# Patient Record
Sex: Male | Born: 1995 | Race: White | Hispanic: No | Marital: Single | Smoking: Never smoker
Health system: Southern US, Community
[De-identification: ages and names within clinical notes are randomized; demographics above are authoritative.]

---
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
## 2015-01-04 ENCOUNTER — Ambulatory Visit: Payer: 59 | Admitting: Family Medicine

## 2015-01-19 ENCOUNTER — Ambulatory Visit (INDEPENDENT_AMBULATORY_CARE_PROVIDER_SITE_OTHER): Payer: 59 | Admitting: Family Medicine

## 2015-01-19 ENCOUNTER — Ambulatory Visit: Payer: 59 | Admitting: Family Medicine

## 2015-01-19 ENCOUNTER — Encounter: Payer: Self-pay | Admitting: Family Medicine

## 2015-01-19 VITALS — BP 110/70 | HR 76 | Temp 98.5°F | Wt 135.0 lb

## 2015-01-19 DIAGNOSIS — L700 Acne vulgaris: Secondary | ICD-10-CM | POA: Diagnosis not present

## 2015-01-19 MED ORDER — DOXYCYCLINE HYCLATE 100 MG PO CAPS: 100.0000 mg | ORAL_CAPSULE | Freq: Every day | ORAL | Status: DC

## 2015-01-19 NOTE — Patient Instructions (Signed)
Be careful for sun sensitivity with doxycycline Consider frequent use of sunscreens Take medication with full glass of water and avoid concomitant use of dairy products with dosing

## 2015-01-19 NOTE — Progress Notes (Signed)
   Subjective:    Patient ID: Luis Howard, male    DOB: 08/09/1995, 19 y.o.   MRN: [id number]  HPI   Here for follow-up acne. Has tried various things over the years. He did well with minocycline in the past but this seemed to lose its effect after some time. He's tried many topicals including benzyl peroxide and clindamycin gel but had excessive drying with each of these and is reluctant to do any topicals at this point. He has some upper back involvement but mostly face. He gets a combination of whiteheads and blackheads. Has never had any cystic acne.  No past medical history on file. No past surgical history on file.  reports that he has never smoked. He does not have any smokeless tobacco history on file. He reports that he does not drink alcohol or use illicit drugs. family history is not on file. No Known Allergies    Review of Systems  Constitutional: Negative for fever and chills.       Objective:   Physical Exam  Constitutional: He appears well-developed and well-nourished. No distress.  Cardiovascular: Normal rate and regular rhythm.   Pulmonary/Chest: Effort normal and breath sounds normal. No respiratory distress. He has no wheezes. He has no rales.  Skin:  Patient has some scattered small  whiteheads and pustules on his face. No cystic changes          Assessment & Plan:  Acne of mild to moderate severity. He is reluctant to try any topicals such as retinoids or topical antibiotics because of excessive drying in the past. Doxycycline 100 mg once daily. Caution about sensitivity

## 2015-01-19 NOTE — Progress Notes (Signed)
Pre visit review using our clinic review tool, if applicable. No additional management support is needed unless otherwise documented below in the visit note. 

## 2015-06-15 ENCOUNTER — Ambulatory Visit (INDEPENDENT_AMBULATORY_CARE_PROVIDER_SITE_OTHER): Payer: 59 | Admitting: Family Medicine

## 2015-06-15 VITALS — BP 100/70 | HR 92 | Temp 97.8°F | Resp 14 | Ht 67.5 in | Wt 141.7 lb

## 2015-06-15 DIAGNOSIS — L709 Acne, unspecified: Secondary | ICD-10-CM | POA: Diagnosis not present

## 2015-06-15 MED ORDER — DOXYCYCLINE HYCLATE 100 MG PO CAPS: ORAL_CAPSULE | ORAL | Status: DC

## 2015-06-15 NOTE — Progress Notes (Signed)
Pre visit review using our clinic review tool, if applicable. No additional management support is needed unless otherwise documented below in the visit note. 

## 2015-06-15 NOTE — Progress Notes (Signed)
   Subjective:    Patient ID: Luis Howard, male    DOB: 12/01/1995, 19 y.o.   MRN: [id number]  HPI Follow-up acne  He just started differin gel about a week ago. Has taken oral antibiotics with doxycycline in the past which worked well and requesting refill. No history of cystic acne. He has combination of whiteheads and blackheads. Uses gentle cleanser at least twice daily   Review of Systems     Objective:   Physical Exam  Constitutional: He appears well-developed and well-nourished.  Cardiovascular: Normal rate and regular rhythm.   Pulmonary/Chest: Effort normal and breath sounds normal. No respiratory distress. He has no wheezes. He has no rales.  Abdominal:  Patient has mild to moderate acne on the face. No cystic changes          Assessment & Plan:  Acne. Mild to moderate severity. Add doxycycline 100 mg twice a day for 2 weeks and then try cutting back to 100 mgs once daily. Continue topical Retin-A as above

## 2015-09-08 ENCOUNTER — Other Ambulatory Visit: Payer: Self-pay | Admitting: Family Medicine

## 2015-10-07 ENCOUNTER — Other Ambulatory Visit: Payer: Self-pay | Admitting: Family Medicine

## 2015-11-21 DIAGNOSIS — H5213 Myopia, bilateral: Secondary | ICD-10-CM | POA: Diagnosis not present

## 2015-11-27 ENCOUNTER — Other Ambulatory Visit: Payer: Self-pay | Admitting: Family Medicine

## 2015-12-03 DIAGNOSIS — L700 Acne vulgaris: Secondary | ICD-10-CM | POA: Diagnosis not present

## 2016-03-05 DIAGNOSIS — L700 Acne vulgaris: Secondary | ICD-10-CM | POA: Diagnosis not present

## 2016-06-13 DIAGNOSIS — L700 Acne vulgaris: Secondary | ICD-10-CM | POA: Diagnosis not present

## 2016-07-31 DIAGNOSIS — J101 Influenza due to other identified influenza virus with other respiratory manifestations: Secondary | ICD-10-CM | POA: Diagnosis not present

## 2016-07-31 DIAGNOSIS — R6889 Other general symptoms and signs: Secondary | ICD-10-CM | POA: Diagnosis not present

## 2016-09-30 DIAGNOSIS — H52223 Regular astigmatism, bilateral: Secondary | ICD-10-CM | POA: Diagnosis not present

## 2016-09-30 DIAGNOSIS — H5213 Myopia, bilateral: Secondary | ICD-10-CM | POA: Diagnosis not present

## 2017-04-24 DIAGNOSIS — Z113 Encounter for screening for infections with a predominantly sexual mode of transmission: Secondary | ICD-10-CM | POA: Diagnosis not present

## 2017-05-17 DIAGNOSIS — R070 Pain in throat: Secondary | ICD-10-CM | POA: Diagnosis not present

## 2017-05-17 DIAGNOSIS — R6889 Other general symptoms and signs: Secondary | ICD-10-CM | POA: Diagnosis not present

## 2017-05-17 DIAGNOSIS — J029 Acute pharyngitis, unspecified: Secondary | ICD-10-CM | POA: Diagnosis not present

## 2017-11-05 DIAGNOSIS — H5213 Myopia, bilateral: Secondary | ICD-10-CM | POA: Diagnosis not present

## 2017-11-05 DIAGNOSIS — H52223 Regular astigmatism, bilateral: Secondary | ICD-10-CM | POA: Diagnosis not present

## 2018-08-06 DIAGNOSIS — J069 Acute upper respiratory infection, unspecified: Secondary | ICD-10-CM | POA: Diagnosis not present
